# Patient Record
Sex: Female | Born: 2000
Health system: Southern US, Community
[De-identification: ages and names within clinical notes are randomized; demographics above are authoritative.]

## PROBLEM LIST (undated history)

## (undated) DIAGNOSIS — R630 Anorexia: Secondary | ICD-10-CM

---
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
## 2020-01-01 DIAGNOSIS — F603 Borderline personality disorder: Secondary | ICD-10-CM | POA: Diagnosis not present

## 2020-01-02 DIAGNOSIS — F603 Borderline personality disorder: Secondary | ICD-10-CM | POA: Diagnosis not present

## 2020-01-06 DIAGNOSIS — F603 Borderline personality disorder: Secondary | ICD-10-CM | POA: Diagnosis not present

## 2020-01-08 DIAGNOSIS — F603 Borderline personality disorder: Secondary | ICD-10-CM | POA: Diagnosis not present

## 2020-01-09 DIAGNOSIS — F603 Borderline personality disorder: Secondary | ICD-10-CM | POA: Diagnosis not present

## 2020-01-14 DIAGNOSIS — F5001 Anorexia nervosa, restricting type: Secondary | ICD-10-CM | POA: Diagnosis not present

## 2020-01-14 DIAGNOSIS — F3181 Bipolar II disorder: Secondary | ICD-10-CM | POA: Diagnosis not present

## 2020-01-14 DIAGNOSIS — F411 Generalized anxiety disorder: Secondary | ICD-10-CM | POA: Diagnosis not present

## 2020-01-14 DIAGNOSIS — F4310 Post-traumatic stress disorder, unspecified: Secondary | ICD-10-CM | POA: Diagnosis not present

## 2020-01-15 DIAGNOSIS — F603 Borderline personality disorder: Secondary | ICD-10-CM | POA: Diagnosis not present

## 2020-01-20 DIAGNOSIS — F603 Borderline personality disorder: Secondary | ICD-10-CM | POA: Diagnosis not present

## 2020-01-22 DIAGNOSIS — F603 Borderline personality disorder: Secondary | ICD-10-CM | POA: Diagnosis not present

## 2020-01-29 DIAGNOSIS — F603 Borderline personality disorder: Secondary | ICD-10-CM | POA: Diagnosis not present

## 2020-01-30 DIAGNOSIS — F603 Borderline personality disorder: Secondary | ICD-10-CM | POA: Diagnosis not present

## 2020-02-05 DIAGNOSIS — F603 Borderline personality disorder: Secondary | ICD-10-CM | POA: Diagnosis not present

## 2020-02-06 DIAGNOSIS — F603 Borderline personality disorder: Secondary | ICD-10-CM | POA: Diagnosis not present

## 2020-02-12 DIAGNOSIS — F603 Borderline personality disorder: Secondary | ICD-10-CM | POA: Diagnosis not present

## 2020-02-19 DIAGNOSIS — F603 Borderline personality disorder: Secondary | ICD-10-CM | POA: Diagnosis not present

## 2020-02-20 ENCOUNTER — Other Ambulatory Visit: Payer: Self-pay

## 2020-02-20 DIAGNOSIS — Z20822 Contact with and (suspected) exposure to covid-19: Secondary | ICD-10-CM

## 2020-02-22 LAB — NOVEL CORONAVIRUS, NAA: SARS-CoV-2, NAA: NOT DETECTED

## 2020-02-26 DIAGNOSIS — F603 Borderline personality disorder: Secondary | ICD-10-CM | POA: Diagnosis not present

## 2020-03-04 DIAGNOSIS — F603 Borderline personality disorder: Secondary | ICD-10-CM | POA: Diagnosis not present

## 2020-03-12 DIAGNOSIS — F603 Borderline personality disorder: Secondary | ICD-10-CM | POA: Diagnosis not present

## 2020-03-18 DIAGNOSIS — F603 Borderline personality disorder: Secondary | ICD-10-CM | POA: Diagnosis not present

## 2020-03-20 DIAGNOSIS — Z01419 Encounter for gynecological examination (general) (routine) without abnormal findings: Secondary | ICD-10-CM | POA: Diagnosis not present

## 2020-03-25 DIAGNOSIS — F603 Borderline personality disorder: Secondary | ICD-10-CM | POA: Diagnosis not present

## 2020-03-31 DIAGNOSIS — F411 Generalized anxiety disorder: Secondary | ICD-10-CM | POA: Diagnosis not present

## 2020-04-01 DIAGNOSIS — F603 Borderline personality disorder: Secondary | ICD-10-CM | POA: Diagnosis not present

## 2020-04-06 DIAGNOSIS — F3181 Bipolar II disorder: Secondary | ICD-10-CM | POA: Diagnosis not present

## 2020-04-06 DIAGNOSIS — F411 Generalized anxiety disorder: Secondary | ICD-10-CM | POA: Diagnosis not present

## 2020-04-06 DIAGNOSIS — F5001 Anorexia nervosa, restricting type: Secondary | ICD-10-CM | POA: Diagnosis not present

## 2020-04-06 DIAGNOSIS — F339 Major depressive disorder, recurrent, unspecified: Secondary | ICD-10-CM | POA: Diagnosis not present

## 2020-04-08 DIAGNOSIS — F603 Borderline personality disorder: Secondary | ICD-10-CM | POA: Diagnosis not present

## 2020-04-15 DIAGNOSIS — F603 Borderline personality disorder: Secondary | ICD-10-CM | POA: Diagnosis not present

## 2020-04-22 DIAGNOSIS — F603 Borderline personality disorder: Secondary | ICD-10-CM | POA: Diagnosis not present

## 2020-04-29 DIAGNOSIS — F603 Borderline personality disorder: Secondary | ICD-10-CM | POA: Diagnosis not present

## 2020-05-06 DIAGNOSIS — L689 Hypertrichosis, unspecified: Secondary | ICD-10-CM | POA: Diagnosis not present

## 2020-05-06 DIAGNOSIS — H6981 Other specified disorders of Eustachian tube, right ear: Secondary | ICD-10-CM | POA: Diagnosis not present

## 2020-05-25 ENCOUNTER — Other Ambulatory Visit: Payer: Self-pay

## 2020-05-25 ENCOUNTER — Encounter (INDEPENDENT_AMBULATORY_CARE_PROVIDER_SITE_OTHER): Payer: Self-pay | Admitting: Otolaryngology

## 2020-05-25 ENCOUNTER — Ambulatory Visit (INDEPENDENT_AMBULATORY_CARE_PROVIDER_SITE_OTHER): Payer: BC Managed Care – PPO | Admitting: Otolaryngology

## 2020-05-25 VITALS — Temp 97.7°F

## 2020-05-25 DIAGNOSIS — J3501 Chronic tonsillitis: Secondary | ICD-10-CM | POA: Diagnosis not present

## 2020-05-25 DIAGNOSIS — J358 Other chronic diseases of tonsils and adenoids: Secondary | ICD-10-CM | POA: Diagnosis not present

## 2020-05-25 NOTE — Progress Notes (Signed)
HPI: Nina Wells is a 19 y.o. female who presents for evaluation of frequent tonsil problems. She states that she has had chronic problems with tonsil stones for over 5 years. More recently has had a metallic taste in her mouth. But she frequently extrudes large tonsil stones. She has had few tonsil infections. She denies any health problems. She apparently received a booster shot in November for Covid and developed some taste problems after the booster shot. Patient presents with her mother. She is in college and is interested in having her tonsils removed next summer when she is out of school.  Past Medical History:  Diagnosis Date  . Anorexia    No past surgical history on file. Social History   Socioeconomic History  . Marital status: Single    Spouse name: Not on file  . Number of children: Not on file  . Years of education: Not on file  . Highest education level: Not on file  Occupational History  . Not on file  Tobacco Use  . Smoking status: Never Smoker  . Smokeless tobacco: Never Used  Vaping Use  . Vaping Use: Some days  Substance and Sexual Activity  . Alcohol use: No  . Drug use: Yes    Types: Marijuana    Comment: Last was 2 weeks ago  . Sexual activity: Never  Other Topics Concern  . Not on file  Social History Narrative  . Not on file   Social Determinants of Health   Financial Resource Strain:   . Difficulty of Paying Living Expenses: Not on file  Food Insecurity:   . Worried About Charity fundraiser in the Last Year: Not on file  . Ran Out of Food in the Last Year: Not on file  Transportation Needs:   . Lack of Transportation (Medical): Not on file  . Lack of Transportation (Non-Medical): Not on file  Physical Activity:   . Days of Exercise per Week: Not on file  . Minutes of Exercise per Session: Not on file  Stress:   . Feeling of Stress : Not on file  Social Connections:   . Frequency of Communication with Friends and Family: Not on file   . Frequency of Social Gatherings with Friends and Family: Not on file  . Attends Religious Services: Not on file  . Active Member of Clubs or Organizations: Not on file  . Attends Archivist Meetings: Not on file  . Marital Status: Not on file   No family history on file. No Known Allergies Prior to Admission medications   Medication Sig Start Date End Date Taking? Authorizing Provider  cyclobenzaprine (FLEXERIL) 10 MG tablet Take 1 tablet by mouth 4 hours before procedure and 4 hours after if needed 10/16/18  Yes Jonathon Resides T, FNP  escitalopram (LEXAPRO) 20 MG tablet Take 1 tablet (20 mg total) by mouth daily. 06/04/19  Yes Trude Mcburney, FNP  lamoTRIgine (LAMICTAL) 100 MG tablet Take 100 mg by mouth daily.   Yes [provider]     Positive ROS: Otherwise negative  All other systems have been reviewed and were otherwise negative with the exception of those mentioned in the HPI and as above.  Physical Exam: Constitutional: Alert, well-appearing, no acute distress Ears: External ears without lesions or tenderness. Ear canals are clear bilaterally with intact, clear TMs.  Nasal: External nose without lesions. Septum midline with mild rhinitis. Clear nasal passages Oral: Lips and gums without lesions. Tongue and palate  mucosa without lesions. Posterior oropharynx clear. Tonsils are 2+ and cryptic with a few small tonsil stones on exam in the office today. No evidence of infection. No peritonsillar swelling. Neck: No palpable adenopathy or masses Respiratory: Breathing comfortably Cardiac exam: Regular rate and rhythm without murmur Lungs: Clear to auscultation. Skin: No facial/neck lesions or rash noted.  Procedures  Assessment: Chronic cryptic tonsils with history of tonsil lifts.  Plan: Discussed with Lovena Le as well as her mother concerning the morbidity associated with tonsillectomy. Concerning reducing tonsil stones discussed oral hygiene with  them as well as use of Listerine or scope and Q-tips or toothbrush to extrude the tonsil stones. They will call us back next year to schedule tonsillectomy if she continues to have problems.  Radene Journey, MD

## 2020-05-27 DIAGNOSIS — F603 Borderline personality disorder: Secondary | ICD-10-CM | POA: Diagnosis not present

## 2020-06-24 DIAGNOSIS — F603 Borderline personality disorder: Secondary | ICD-10-CM | POA: Diagnosis not present

## 2020-07-30 DIAGNOSIS — L680 Hirsutism: Secondary | ICD-10-CM | POA: Diagnosis not present

## 2020-08-08 DIAGNOSIS — F332 Major depressive disorder, recurrent severe without psychotic features: Secondary | ICD-10-CM | POA: Diagnosis not present

## 2020-08-08 DIAGNOSIS — F603 Borderline personality disorder: Secondary | ICD-10-CM | POA: Diagnosis not present

## 2020-08-14 DIAGNOSIS — F332 Major depressive disorder, recurrent severe without psychotic features: Secondary | ICD-10-CM | POA: Diagnosis not present

## 2020-08-14 DIAGNOSIS — F603 Borderline personality disorder: Secondary | ICD-10-CM | POA: Diagnosis not present

## 2020-08-22 DIAGNOSIS — F331 Major depressive disorder, recurrent, moderate: Secondary | ICD-10-CM | POA: Diagnosis not present

## 2020-08-22 DIAGNOSIS — F603 Borderline personality disorder: Secondary | ICD-10-CM | POA: Diagnosis not present

## 2021-11-16 ENCOUNTER — Other Ambulatory Visit: Payer: Self-pay | Admitting: Obstetrics and Gynecology

## 2021-11-16 DIAGNOSIS — N644 Mastodynia: Secondary | ICD-10-CM
